# Patient Record
Sex: Female | Born: 1970 | Race: White | Hispanic: No | Marital: Single | Smoking: Never smoker
Health system: Southern US, Community
[De-identification: ages and names within clinical notes are randomized; demographics above are authoritative.]

## PROBLEM LIST (undated history)

## (undated) DIAGNOSIS — R569 Unspecified convulsions: Secondary | ICD-10-CM

## (undated) DIAGNOSIS — O009 Unspecified ectopic pregnancy without intrauterine pregnancy: Secondary | ICD-10-CM

## (undated) DIAGNOSIS — N200 Calculus of kidney: Secondary | ICD-10-CM

## (undated) DIAGNOSIS — F32A Depression, unspecified: Secondary | ICD-10-CM

## (undated) HISTORY — PX: TUBAL LIGATION: SHX77

## (undated) HISTORY — DX: Depression, unspecified: F32.A

---

## 2016-03-24 ENCOUNTER — Emergency Department
Admission: EM | Admit: 2016-03-24 | Discharge: 2016-03-24 | Disposition: A | Discharge status: Home / Self Care | Payer: Commercial Managed Care - PPO | Attending: Emergency Medicine | Admitting: Emergency Medicine

## 2016-03-24 ENCOUNTER — Emergency Department: Payer: Commercial Managed Care - PPO

## 2016-03-24 ENCOUNTER — Encounter: Payer: Self-pay | Admitting: Emergency Medicine

## 2016-03-24 DIAGNOSIS — N200 Calculus of kidney: Secondary | ICD-10-CM | POA: Diagnosis not present

## 2016-03-24 DIAGNOSIS — R109 Unspecified abdominal pain: Secondary | ICD-10-CM | POA: Diagnosis present

## 2016-03-24 DIAGNOSIS — Z79899 Other long term (current) drug therapy: Secondary | ICD-10-CM | POA: Diagnosis not present

## 2016-03-24 HISTORY — DX: Unspecified ectopic pregnancy without intrauterine pregnancy: O00.90

## 2016-03-24 HISTORY — DX: Calculus of kidney: N20.0

## 2016-03-24 HISTORY — DX: Unspecified convulsions: R56.9

## 2016-03-24 LAB — URINALYSIS, COMPLETE (UACMP) WITH MICROSCOPIC
Bilirubin Urine: NEGATIVE
Glucose, UA: NEGATIVE mg/dL
Ketones, ur: NEGATIVE mg/dL
Leukocytes, UA: NEGATIVE
Nitrite: NEGATIVE
Protein, ur: NEGATIVE mg/dL
SPECIFIC GRAVITY, URINE: 1.012 (ref 1.005–1.030)
pH: 6 (ref 5.0–8.0)

## 2016-03-24 LAB — BASIC METABOLIC PANEL
ANION GAP: 9 (ref 5–15)
BUN: 15 mg/dL (ref 6–20)
CHLORIDE: 103 mmol/L (ref 101–111)
CO2: 25 mmol/L (ref 22–32)
Calcium: 9.1 mg/dL (ref 8.9–10.3)
Creatinine, Ser: 1.06 mg/dL — ABNORMAL HIGH (ref 0.44–1.00)
GFR calc Af Amer: >60 mL/min (ref >60)
GLUCOSE: 139 mg/dL — AB (ref 65–99)
POTASSIUM: 3.9 mmol/L (ref 3.5–5.1)
Sodium: 137 mmol/L (ref 135–145)

## 2016-03-24 LAB — CBC
HEMATOCRIT: 39.2 % (ref 35.0–47.0)
HEMOGLOBIN: 13.4 g/dL (ref 12.0–16.0)
MCH: 28.6 pg (ref 26.0–34.0)
MCHC: 34.1 g/dL (ref 32.0–36.0)
MCV: 84 fL (ref 80.0–100.0)
Platelets: 246 10*3/uL (ref 150–440)
RBC: 4.68 MIL/uL (ref 3.80–5.20)
RDW: 13.6 % (ref 11.5–14.5)
WBC: 16 10*3/uL — AB (ref 3.6–11.0)

## 2016-03-24 MED ORDER — OXYCODONE-ACETAMINOPHEN 5-325 MG PO TABS: 1.0000 | ORAL_TABLET | Freq: Four times a day (QID) | ORAL | 0 refills | Status: DC | PRN

## 2016-03-24 MED ORDER — ONDANSETRON HCL 4 MG/2ML IJ SOLN
4.0000 mg | Freq: Once | INTRAMUSCULAR | Status: AC
  Administered 2016-03-24: 4 mg via INTRAVENOUS

## 2016-03-24 MED ORDER — TAMSULOSIN HCL 0.4 MG PO CAPS: 0.4000 mg | ORAL_CAPSULE | Freq: Every day | ORAL | 0 refills | Status: DC

## 2016-03-24 MED ORDER — ONDANSETRON HCL 4 MG/2ML IJ SOLN
INTRAMUSCULAR | Status: AC
  Filled 2016-03-24: qty 2

## 2016-03-24 MED ORDER — KETOROLAC TROMETHAMINE 30 MG/ML IJ SOLN
30.0000 mg | Freq: Once | INTRAMUSCULAR | Status: AC
  Administered 2016-03-24: 30 mg via INTRAVENOUS
  Filled 2016-03-24: qty 1

## 2016-03-24 MED ORDER — MORPHINE SULFATE (PF) 4 MG/ML IV SOLN
4.0000 mg | Freq: Once | INTRAVENOUS | Status: AC
  Administered 2016-03-24: 4 mg via INTRAVENOUS
  Filled 2016-03-24: qty 1

## 2016-03-24 MED ORDER — ONDANSETRON 4 MG PO TBDP: 4.0000 mg | ORAL_TABLET | Freq: Three times a day (TID) | ORAL | 0 refills | Status: DC | PRN

## 2016-03-24 NOTE — ED Triage Notes (Signed)
Patient reports left lower back/flank pain since Friday morning.  Reports increase in urinary output.

## 2016-03-24 NOTE — ED Provider Notes (Addendum)
[facility] Emergency Department Provider Note   ____________________________________________   First MD Initiated Contact with Patient 03/24/16 [id number]     (approximate)  I have reviewed the triage vital signs and the nursing notes.   HISTORY  Chief Complaint Back Pain and Flank Pain    HPI Margaret Cardenas is a 45 y.o. female who comes into the hospital today with some severe left flank pain. She reports that it was achy this morning but it has been progressively getting worse. She reports that it was at its worse this evening and has been persistent. She has had similar pain she reports in 2008 when she had a kidney stone. The patient denies any pain with urination and has not noticed any blood in her urine. The patient reports she also had some nausea and vomiting today which is what prompted her to come into the hospital. The patient had no fevers and no chills. She is here today for evaluation. The patient rates her pain a 10 out of 10 in intensity.   Past Medical History:  Diagnosis Date  . Ectopic pregnancy   . Kidney stones   . Seizures (HCC)     There are no active problems to display for this patient.   Past Surgical History:  Procedure Laterality Date  . TUBAL LIGATION      Prior to Admission medications   Medication Sig Start Date End Date Taking? Authorizing Provider  amLODipine (NORVASC) 10 MG tablet Take 10 mg by mouth daily.   Yes Historical Provider, MD  norgestimate-ethinyl estradiol (ORTHO-CYCLEN,SPRINTEC,PREVIFEM) 0.25-35 MG-MCG tablet Take 1 tablet by mouth daily.   Yes Historical Provider, MD  venlafaxine (EFFEXOR) 75 MG tablet Take 75 mg by mouth daily.   Yes Historical Provider, MD  ondansetron (ZOFRAN ODT) 4 MG disintegrating tablet Take 1 tablet (4 mg total) by mouth every 8 (eight) hours as needed for nausea or vomiting. 03/24/16   Rebecka Apley, MD  oxyCODONE-acetaminophen (ROXICET) 5-325 MG tablet Take 1 tablet by  mouth every 6 (six) hours as needed. 03/24/16   Rebecka Apley, MD  tamsulosin (FLOMAX) 0.4 MG CAPS capsule Take 1 capsule (0.4 mg total) by mouth daily. 03/24/16   Rebecka Apley, MD    Allergies Augmentin [amoxicillin-pot clavulanate]  No family history on file.  Social History Social History  Substance Use Topics  . Smoking status: Never Smoker  . Smokeless tobacco: Not on file  . Alcohol use Yes    Review of Systems Constitutional: No fever/chills Eyes: No visual changes. ENT: No sore throat. Cardiovascular: Denies chest pain. Respiratory: Denies shortness of breath. Gastrointestinal: Nausea and vomiting, No abdominal pain. No diarrhea.  No constipation. Genitourinary: Negative for dysuria. Musculoskeletal: back pain. Skin: Negative for rash. Neurological: Negative for headaches, focal weakness or numbness.  10-point ROS otherwise negative.  ____________________________________________   PHYSICAL EXAM:  VITAL SIGNS: ED Triage Vitals [03/24/16 0306]  Enc Vitals Group     BP 121/75     Pulse Rate (!) 104     Resp 18     Temp 98.7 F (37.1 C)     Temp Source Oral     SpO2 99 %     Weight 226 lb (102.5 kg)     Height 5' (1.524 m)     Head Circumference      Peak Flow      Pain Score 10     Pain Loc      Pain Edu?  Excl. in GC?     Constitutional: Alert and oriented. Well appearing and in Water distress. Eyes: Conjunctivae are normal. PERRL. EOMI. Head: Atraumatic. Nose: No congestion/rhinnorhea. Mouth/Throat: Mucous membranes are moist.  Oropharynx non-erythematous. Cardiovascular: Normal rate, regular rhythm. Grossly normal heart sounds.  Good peripheral circulation. Respiratory: Normal respiratory effort.  No retractions. Lungs CTAB. Gastrointestinal: Soft and nontender. No distention. Positive bowel sounds left CVA tenderness to palpation Musculoskeletal: No lower extremity tenderness nor edema.   Neurologic:  Normal speech and language.    Skin:  Skin is warm, dry and intact.  Psychiatric: Mood and affect are normal.   ____________________________________________   LABS (all labs ordered are listed, but only abnormal results are displayed)  Labs Reviewed  URINALYSIS, COMPLETE (UACMP) WITH MICROSCOPIC - Abnormal; Notable for the following:       Result Value   Color, Urine STRAW (*)    APPearance CLEAR (*)    Hgb urine dipstick SMALL (*)    Bacteria, UA RARE (*)    Squamous Epithelial / LPF 0-5 (*)    All other components within normal limits  CBC - Abnormal; Notable for the following:    WBC 16.0 (*)    All other components within normal limits  BASIC METABOLIC PANEL - Abnormal; Notable for the following:    Glucose, Bld 139 (*)    Creatinine, Ser 1.06 (*)    All other components within normal limits   ____________________________________________  EKG  none ____________________________________________  RADIOLOGY  CT renal stone study ____________________________________________   PROCEDURES  Procedure(s) performed: None  Procedures  Critical Care performed: No  ____________________________________________   INITIAL IMPRESSION / ASSESSMENT AND PLAN / ED COURSE  Pertinent labs & imaging results that were available during my care of the patient were reviewed by me and considered in my medical decision making (see chart for details).  This is a 45 year old female who comes into the hospital today with some left flank pain. The patient's urine does not show any blood but as 20% of stones do not show blood I will send the patient for a CT scan. I will give the patient a liter of normal saline, Zofran and morphine for her pain. She will be reassessed once I received those results. The patient's blood work shows a creatinine of 1.06 and a white blood cell count of 16.  Clinical Course as of Mar 24 636  Sat Mar 24, 2016  [id number] A 5 mm left UPJ stone with mild left hydronephrosis. Correlation with  urinalysis recommended to exclude superimposed UTI.   CT Renal Soundra PilonStone Study [AW]    Clinical Course User Index [AW] Rebecka ApleyAllison P Webster, MD   The patient does have a 5 mm left UPJ stone at this time. I will give the patient a dose of Toradol and then reassess the patient.   The patient's pain is improved. She'll be discharged home to follow-up with urology should the pain continue and she not pass her stone.  ____________________________________________   FINAL CLINICAL IMPRESSION(S) / ED DIAGNOSES  Final diagnoses:  Kidney stone      NEW MEDICATIONS STARTED DURING THIS VISIT:  New Prescriptions   ONDANSETRON (ZOFRAN ODT) 4 MG DISINTEGRATING TABLET    Take 1 tablet (4 mg total) by mouth every 8 (eight) hours as needed for nausea or vomiting.   OXYCODONE-ACETAMINOPHEN (ROXICET) 5-325 MG TABLET    Take 1 tablet by mouth every 6 (six) hours as needed.   TAMSULOSIN (FLOMAX) 0.4 MG CAPS CAPSULE  Take 1 capsule (0.4 mg total) by mouth daily.     Note:  This document was prepared using Dragon voice recognition software and may include unintentional dictation errors.    Rebecka ApleyAllison P Webster, MD 03/24/16 [id number]    Rebecka ApleyAllison P Webster, MD 03/24/16 [phone]

## 2018-11-13 IMAGING — CT CT RENAL STONE PROTOCOL
2 of 4 series · 16 of 46 positions shown, 18 images · non-contrast
Comparison: None.

CLINICAL DATA: 45-year-old female with left flank pain.

EXAM:
CT ABDOMEN AND PELVIS WITHOUT CONTRAST
TECHNIQUE: Multidetector CT imaging of the abdomen and pelvis was performed
following the standard protocol without IV contrast.

[Series 2: stone full standard · axial · 0.75mm/px · z∈[-1026,-616]mm · 13 of 90 slices shown, 15 images]
[im 4/90  soft-tissue]
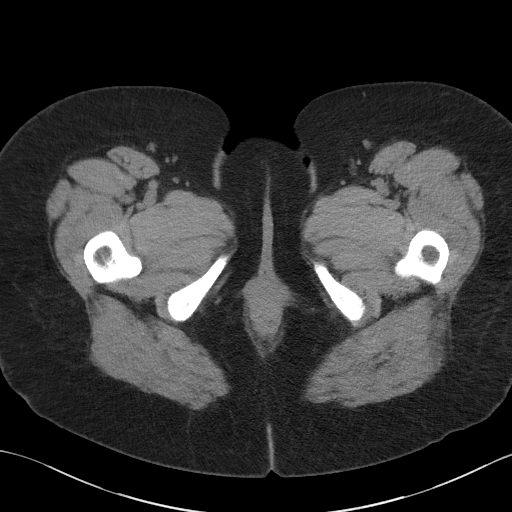
[im 4/90  bone]
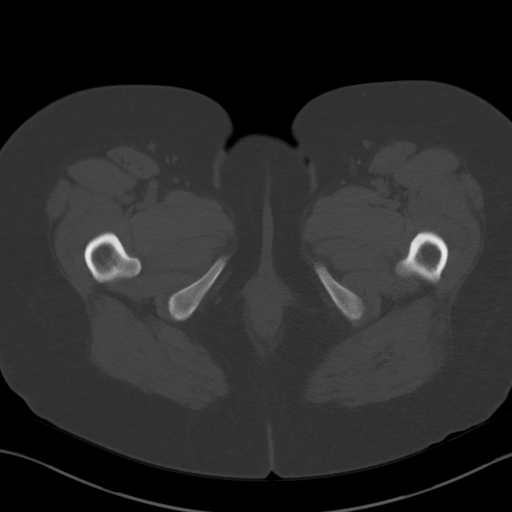
[im 12/90  soft-tissue]
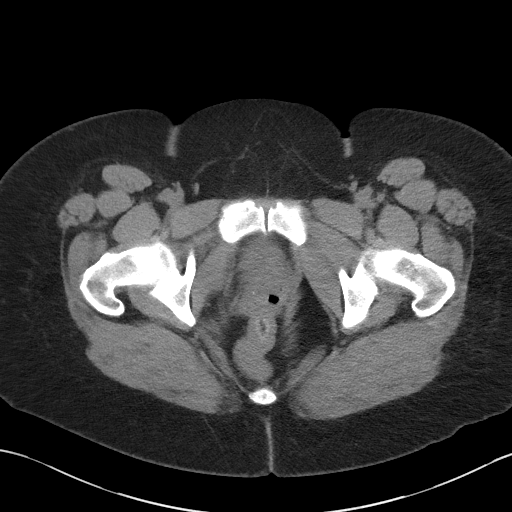
[im 20/90  soft-tissue]
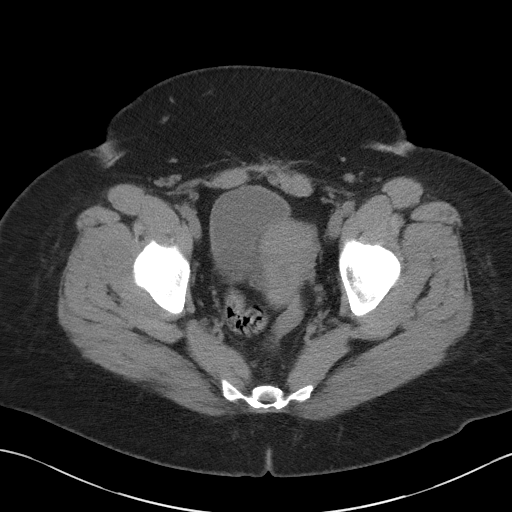
[im 24/90  soft-tissue]
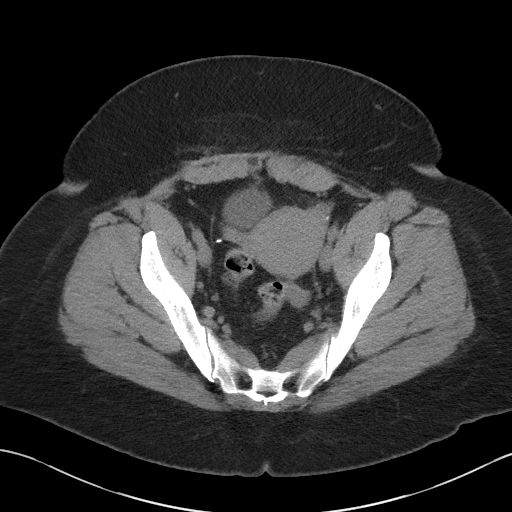
[im 31/90  soft-tissue]
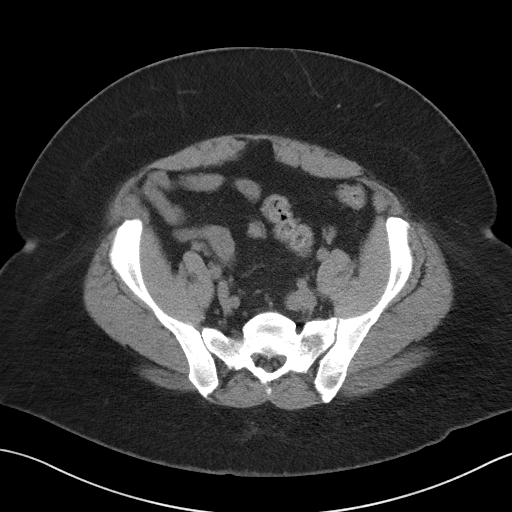
[im 39/90  soft-tissue]
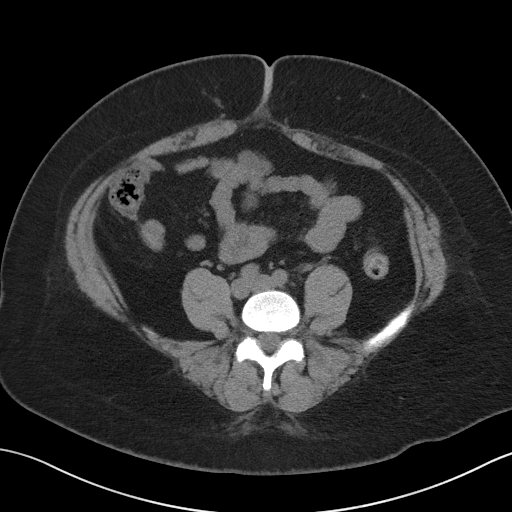
[im 47/90  soft-tissue]
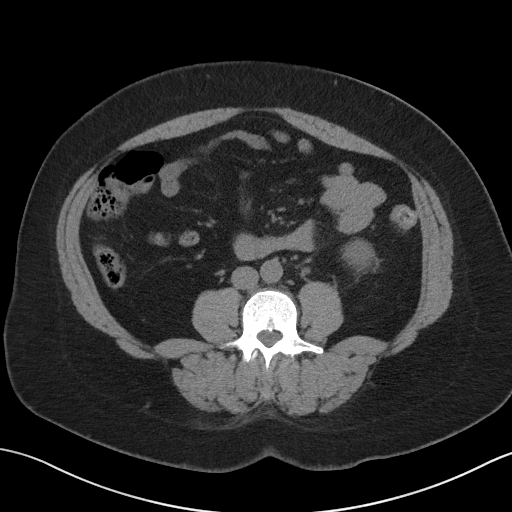
[im 51/90  soft-tissue]
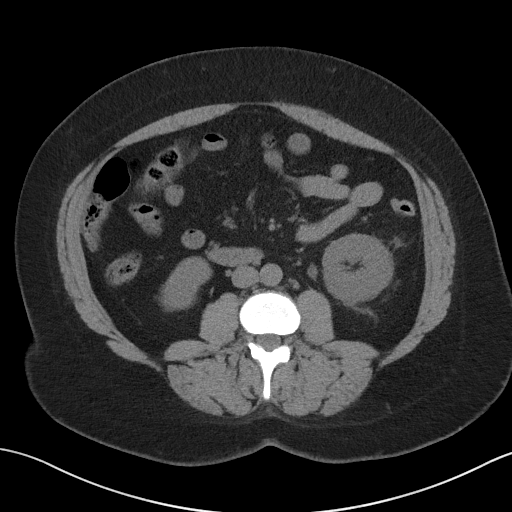
[im 59/90  soft-tissue]
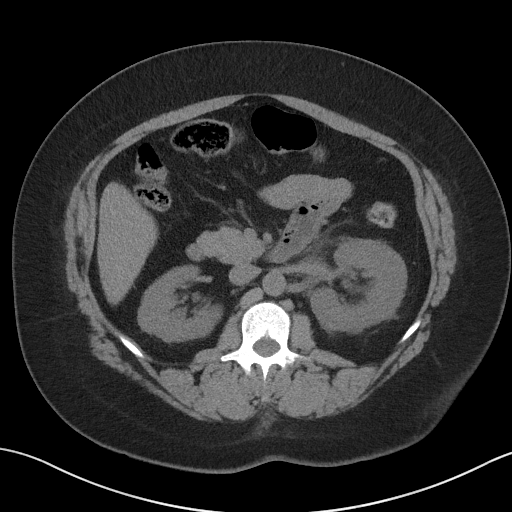
[im 59/90  bone]
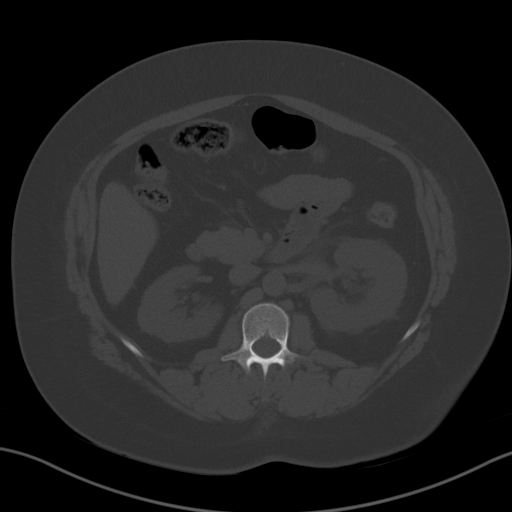
[im 66/90  soft-tissue]
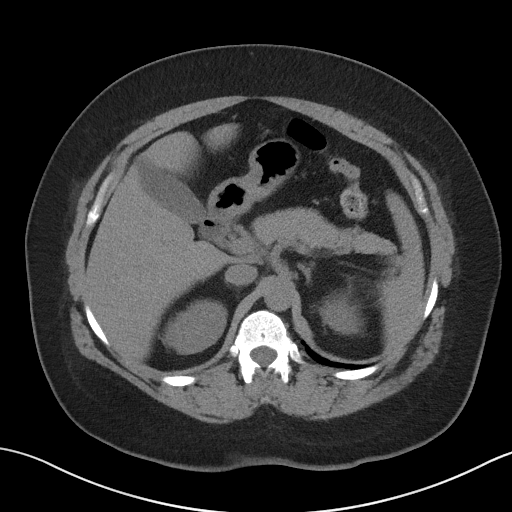
[im 70/90  soft-tissue]
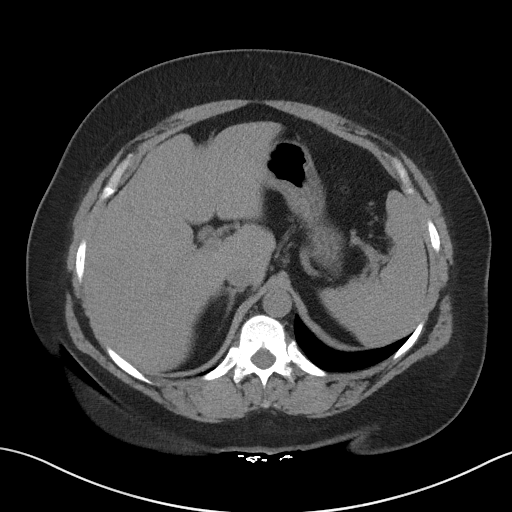
[im 78/90  soft-tissue]
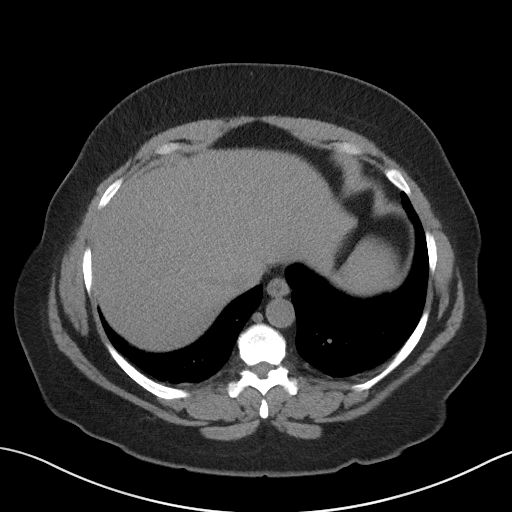
[im 86/90  soft-tissue]
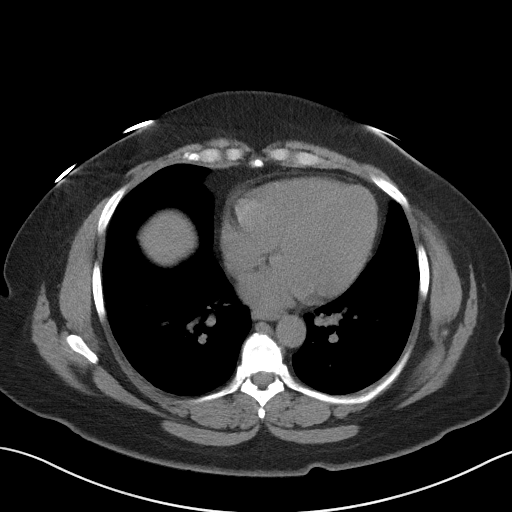

[Series 5: coronal · coronal · 0.75mm/px · 3 of 151 slices shown]
[im 51/151  soft-tissue]
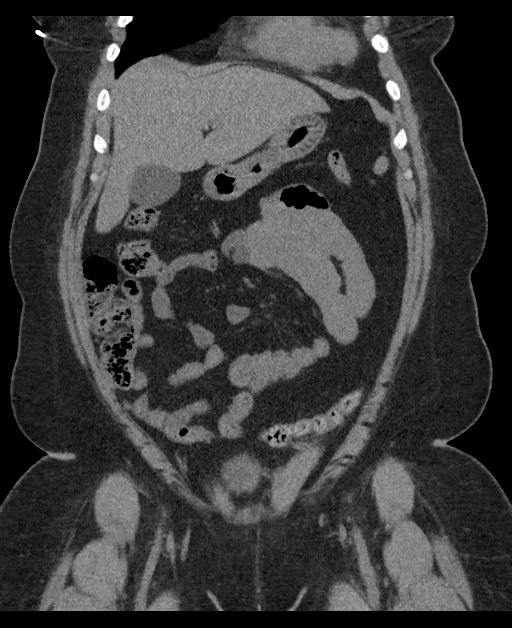
[im 67/151  soft-tissue]
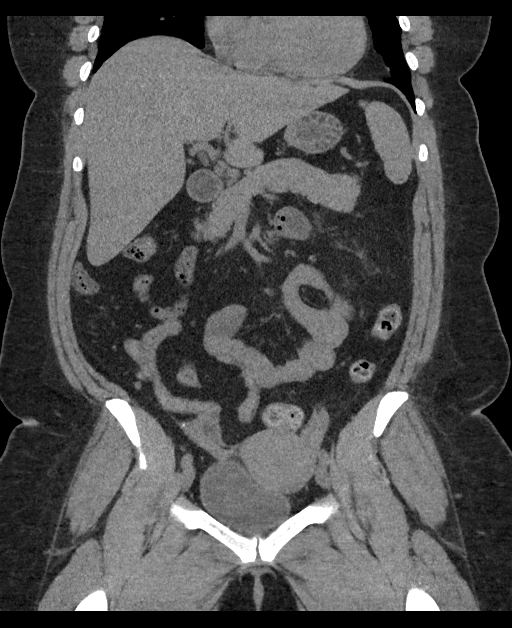
[im 84/151  soft-tissue]
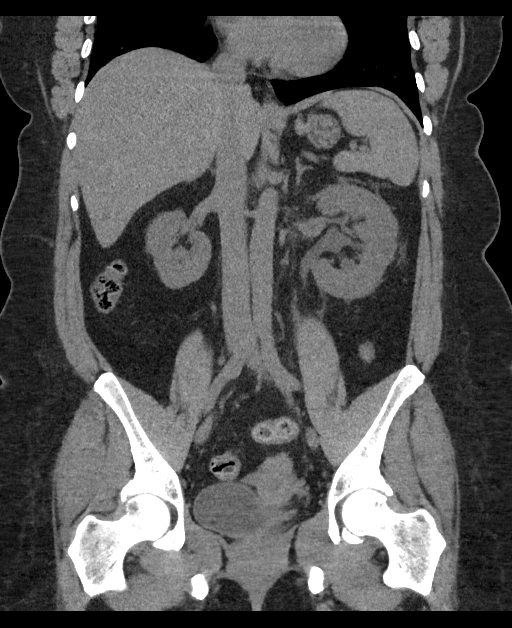

[16 of 46 positions shown; findings below may reference images not displayed]

FINDINGS: Evaluation of this exam is limited in the absence of intravenous
contrast.

Lower chest: The visualized lung bases are clear. There is
hypoattenuation of the cardiac blood pool suggestive of a degree of
anemia. Clinical correlation is recommended.

No intra-abdominal free air or free fluid.

Hepatobiliary: Diffuse fatty infiltration of the liver. No
intrahepatic biliary ductal dilatation. The gallbladder is
unremarkable.

Pancreas: Unremarkable. No pancreatic ductal dilatation or
surrounding inflammatory changes.

Spleen: Normal in size without focal abnormality.

Adrenals/Urinary Tract: The adrenal glands appear unremarkable.
There is a 5 mm left ureteropelvic junction stone with mild left
hydronephrosis. There is mild left perinephric stranding. The right
kidney, right ureter, and urinary bladder appear unremarkable.

Stomach/Bowel: There is moderate stool within the colon. No evidence
of bowel obstruction or active inflammation. Normal appendix.

Vascular/Lymphatic: The abdominal aorta and IVC are grossly
unremarkable on this noncontrast study. No portal venous gas
identified. There is no adenopathy.

Reproductive: The uterus is anteverted and grossly unremarkable.
Bilateral tubal ligation clips noted. The ovaries appear
unremarkable as well.

Other: None

Musculoskeletal: No acute or significant osseous findings.
IMPRESSION: A 5 mm left UPJ stone with mild left hydronephrosis. Correlation
with urinalysis recommended to exclude superimposed UTI.

## 2020-03-17 ENCOUNTER — Encounter: Payer: Self-pay | Admitting: Obstetrics and Gynecology

## 2020-03-17 ENCOUNTER — Ambulatory Visit: Payer: Commercial Managed Care - PPO | Admitting: Obstetrics and Gynecology

## 2020-03-17 ENCOUNTER — Other Ambulatory Visit (HOSPITAL_COMMUNITY)
Admission: RE | Admit: 2020-03-17 | Discharge: 2020-03-17 | Disposition: A | Discharge status: Home / Self Care | Payer: Commercial Managed Care - PPO | Source: Ambulatory Visit | Attending: Obstetrics and Gynecology | Admitting: Obstetrics and Gynecology

## 2020-03-17 ENCOUNTER — Other Ambulatory Visit: Payer: Self-pay

## 2020-03-17 VITALS — BP 115/86 | HR 81 | Ht 60.0 in | Wt 233.9 lb

## 2020-03-17 DIAGNOSIS — N921 Excessive and frequent menstruation with irregular cycle: Secondary | ICD-10-CM | POA: Insufficient documentation

## 2020-03-17 NOTE — Progress Notes (Signed)
HPI:      [name] is a 49 y.o. No obstetric history on file. who LMP was No LMP recorded. (Menstrual status: Other).  Subjective:   She presents today stating that over the last year her menstrual periods have become very heavy and long.  She sometimes has 2 periods per month or bleeds for 20 days/month.  She is understandably frustrated by this.  She does report that she occasionally has night sweats but denies hot flashes.  She is not currently sexually active and not using anything for birth control. She would like some cycle control so that she does not bleed to this extent.    Hx: The following portions of the patient's history were reviewed and updated as appropriate:             She  has a past medical history of Depression, Ectopic pregnancy, Kidney stones, and Seizures (HCC). She does not have a problem list on file. She  has a past surgical history that includes Tubal ligation. Her family history is not on file. She  reports that she has never smoked. She has never used smokeless tobacco. She reports current alcohol use. She reports that she does not use drugs. She has a current medication list which includes the following prescription(s): brexpiprazole and venlafaxine hcl. She is allergic to augmentin [amoxicillin-pot clavulanate].       Review of Systems:  Review of Systems  Constitutional: Denied constitutional symptoms, night sweats, recent illness, fatigue, fever, insomnia and weight loss.  Eyes: Denied eye symptoms, eye pain, photophobia, vision change and visual disturbance.  Ears/Nose/Throat/Neck: Denied ear, nose, throat or neck symptoms, hearing loss, nasal discharge, sinus congestion and sore throat.  Cardiovascular: Denied cardiovascular symptoms, arrhythmia, chest pain/pressure, edema, exercise intolerance, orthopnea and palpitations.  Respiratory: Denied pulmonary symptoms, asthma, pleuritic pain, productive sputum, cough, dyspnea and wheezing.   Gastrointestinal: Denied, gastro-esophageal reflux, melena, nausea and vomiting.  Genitourinary: See HPI for additional information.  Musculoskeletal: Denied musculoskeletal symptoms, stiffness, swelling, muscle weakness and myalgia.  Dermatologic: Denied dermatology symptoms, rash and scar.  Neurologic: Denied neurology symptoms, dizziness, headache, neck pain and syncope.  Psychiatric: Denied psychiatric symptoms, anxiety and depression.  Endocrine: Denied endocrine symptoms including hot flashes and night sweats.   Meds:   Current Outpatient Medications on File Prior to Visit  Medication Sig Dispense Refill  . brexpiprazole (REXULTI) 1 MG TABS tablet Take by mouth.    . Venlafaxine HCl 225 MG TB24      No current facility-administered medications on file prior to visit.          Objective:     Vitals:   03/17/20 1435  BP: 115/86  Pulse: 81   Filed Weights   03/17/20 1435  Weight: 233 lb 14.4 oz (106.1 kg)              Physical examination   Pelvic:   Vulva: Normal appearance.  No lesions.  Vagina: No lesions or abnormalities noted.  Support: Normal pelvic support.  Urethra No masses tenderness or scarring.  Meatus Normal size without lesions or prolapse.  Cervix: Normal appearance.  No lesions.  Anus: Normal exam.  No lesions.  Perineum: Normal exam.  No lesions.        Bimanual   Uterus: Normal size.  Non-tender.  Mobile.  AV.  Adnexae: No masses.  Non-tender to palpation.  Cul-de-sac: Negative for abnormality.   Endometrial Biopsy After discussion with the patient regarding her abnormal uterine bleeding I  recommended that she proceed with an endometrial biopsy for further diagnosis. The risks, benefits, alternatives, and indications for an endometrial biopsy were discussed with the patient in detail. She understood the risks including infection, bleeding, cervical laceration and uterine perforation.  Verbal consent was obtained.   PROCEDURE NOTE:  Vacurette  endometrial biopsy was performed using aseptic technique with iodine preparation.  The uterus was sounded to a length of 8cm.  Adequate sampling was obtained with minimal blood loss.  The patient tolerated the procedure well.  Disposition will be pending pathology   Assessment:    No obstetric history on file. There are no problems to display for this patient.    1. Menometrorrhagia        Plan:            1. Rule out uterine fibroids with ultrasound  2. Await endometrial biopsy results  3. Plan IUD for cycle control Orders Orders Placed This Encounter  Procedures  . US PELVIS (TRANSABDOMINAL ONLY)  . US PELVIS TRANSVAGINAL NON-OB (TV ONLY)    No orders of the defined types were placed in this encounter.     F/U  Return in about 3 weeks (around 04/07/2020). I spent 32 minutes involved in the care of this patient preparing to see the patient by obtaining and reviewing her medical history (including labs, imaging tests and prior procedures), documenting clinical information in the electronic health record (EHR), counseling and coordinating care plans, writing and sending prescriptions, ordering tests or procedures and directly communicating with the patient by discussing pertinent items from her history and physical exam as well as detailing my assessment and plan as noted above so that she has an informed understanding.  All of her questions were answered.  Elonda Husky, M.D. 03/17/2020 3:10 PM

## 2020-03-17 NOTE — Addendum Note (Signed)
Addended by: Dorian Pod on: 03/17/2020 03:44 PM   Modules accepted: Orders

## 2020-03-21 LAB — SURGICAL PATHOLOGY

## 2020-03-22 ENCOUNTER — Telehealth: Payer: Self-pay

## 2020-03-22 NOTE — Telephone Encounter (Signed)
Informed patient of negative biopsy results per Dr Logan Bores.

## 2020-03-24 ENCOUNTER — Other Ambulatory Visit: Payer: Commercial Managed Care - PPO

## 2020-03-31 ENCOUNTER — Ambulatory Visit (INDEPENDENT_AMBULATORY_CARE_PROVIDER_SITE_OTHER): Payer: Commercial Managed Care - PPO

## 2020-03-31 ENCOUNTER — Other Ambulatory Visit: Payer: Self-pay

## 2020-03-31 DIAGNOSIS — N921 Excessive and frequent menstruation with irregular cycle: Secondary | ICD-10-CM | POA: Diagnosis not present

## 2020-04-07 ENCOUNTER — Encounter: Payer: Self-pay | Admitting: Obstetrics and Gynecology

## 2020-04-07 ENCOUNTER — Other Ambulatory Visit: Payer: Self-pay

## 2020-04-07 ENCOUNTER — Ambulatory Visit: Payer: Commercial Managed Care - PPO | Admitting: Obstetrics and Gynecology

## 2020-04-07 VITALS — BP 134/81 | HR 86 | Ht 60.0 in | Wt 232.5 lb

## 2020-04-07 DIAGNOSIS — Z30430 Encounter for insertion of intrauterine contraceptive device: Secondary | ICD-10-CM | POA: Diagnosis not present

## 2020-04-07 DIAGNOSIS — N921 Excessive and frequent menstruation with irregular cycle: Secondary | ICD-10-CM

## 2020-04-07 NOTE — Progress Notes (Signed)
HPI:      [name] is a 50 y.o. No obstetric history on file. who LMP was Patient's last menstrual period was 03/08/2020.  Subjective:   She presents today for IUD insertion.  She has had irregular bleeding and wants the IUD for both cycle and birth control.  A recent endometrial biopsy showed no evidence of hyperplasia or malignancy.  An ultrasound showed no contraindication to IUD placement.  Patient states that she has not recently been sexually active and thus could not be pregnant.    Hx: The following portions of the patient's history were reviewed and updated as appropriate:             She  has a past medical history of Depression, Ectopic pregnancy, Kidney stones, and Seizures (HCC). She does not have a problem list on file. She  has a past surgical history that includes Tubal ligation. Her family history is not on file. She  reports that she has never smoked. She has never used smokeless tobacco. She reports current alcohol use. She reports that she does not use drugs. She has a current medication list which includes the following prescription(s): brexpiprazole and venlafaxine hcl. She is allergic to augmentin [amoxicillin-pot clavulanate].       Review of Systems:  Review of Systems  Constitutional: Denied constitutional symptoms, night sweats, recent illness, fatigue, fever, insomnia and weight loss.  Eyes: Denied eye symptoms, eye pain, photophobia, vision change and visual disturbance.  Ears/Nose/Throat/Neck: Denied ear, nose, throat or neck symptoms, hearing loss, nasal discharge, sinus congestion and sore throat.  Cardiovascular: Denied cardiovascular symptoms, arrhythmia, chest pain/pressure, edema, exercise intolerance, orthopnea and palpitations.  Respiratory: Denied pulmonary symptoms, asthma, pleuritic pain, productive sputum, cough, dyspnea and wheezing.  Gastrointestinal: Denied, gastro-esophageal reflux, melena, nausea and vomiting.  Genitourinary: Denied  genitourinary symptoms including symptomatic vaginal discharge, pelvic relaxation issues, and urinary complaints.  Musculoskeletal: Denied musculoskeletal symptoms, stiffness, swelling, muscle weakness and myalgia.  Dermatologic: Denied dermatology symptoms, rash and scar.  Neurologic: Denied neurology symptoms, dizziness, headache, neck pain and syncope.  Psychiatric: Denied psychiatric symptoms, anxiety and depression.  Endocrine: Denied endocrine symptoms including hot flashes and night sweats.   Meds:   Current Outpatient Medications on File Prior to Visit  Medication Sig Dispense Refill  . brexpiprazole (REXULTI) 1 MG TABS tablet Take by mouth.    . Venlafaxine HCl 225 MG TB24      No current facility-administered medications on file prior to visit.    Objective:     Vitals:   04/07/20 0846  BP: 134/81  Pulse: 86    Physical examination   Pelvic:   Vulva: Normal appearance.  No lesions.  Vagina: No lesions or abnormalities noted.  Support: Normal pelvic support.  Urethra No masses tenderness or scarring.  Meatus Normal size without lesions or prolapse.  Cervix: Normal appearance.  No lesions.  Anus: Normal exam.  No lesions.  Perineum: Normal exam.  No lesions.        Bimanual   Uterus: Normal size.  Non-tender.  Mobile.  AV.  Adnexae: No masses.  Non-tender to palpation.  Cul-de-sac: Negative for abnormality.   IUD Procedure Pt has read the booklet and signed the appropriate forms regarding the Mirena IUD.  All of her questions have been answered.   The cervix was cleansed with betadine solution.  After sounding the uterus and noting the position, the IUD was placed in the usual manner without problem.  The string was cut to  the appropriate length.  The patient tolerated the procedure well.       NDC # = [id number]        Assessment:    No obstetric history on file. There are no problems to display for this patient.    1. Encounter for insertion of mirena  IUD   2. Menometrorrhagia       Plan:             F/U  Return in about 4 weeks (around 05/05/2020) for For IUD f/u.  Elonda Husky, M.D. 04/07/2020 9:30 AM

## 2020-05-18 ENCOUNTER — Encounter: Payer: Commercial Managed Care - PPO | Admitting: Obstetrics and Gynecology

## 2020-05-24 ENCOUNTER — Ambulatory Visit: Payer: Commercial Managed Care - PPO | Admitting: Obstetrics and Gynecology

## 2020-05-24 ENCOUNTER — Encounter: Payer: Self-pay | Admitting: Obstetrics and Gynecology

## 2020-05-24 ENCOUNTER — Other Ambulatory Visit: Payer: Self-pay

## 2020-05-24 VITALS — BP 134/78 | HR 101 | Ht 60.0 in | Wt 231.7 lb

## 2020-05-24 DIAGNOSIS — N921 Excessive and frequent menstruation with irregular cycle: Secondary | ICD-10-CM | POA: Diagnosis not present

## 2020-05-24 DIAGNOSIS — Z30431 Encounter for routine checking of intrauterine contraceptive device: Secondary | ICD-10-CM

## 2020-05-24 NOTE — Progress Notes (Signed)
HPI:      [name] is a 50 y.o. No obstetric history on file. who LMP was No LMP recorded. (Menstrual status: IUD).  Subjective:   She presents today for follow-up of IUD.  She got an IUD for menometrorrhagia.  She is not currently using it for birth control.  She is very excited that she has had 15 bleeding free days since the placement of the IUD.  This is the most that she has had in a long time.    Hx: The following portions of the patient's history were reviewed and updated as appropriate:             She  has a past medical history of Depression, Ectopic pregnancy, Kidney stones, and Seizures (HCC). She does not have a problem list on file. She  has a past surgical history that includes Tubal ligation. Her family history is not on file. She  reports that she has never smoked. She has never used smokeless tobacco. She reports current alcohol use. She reports that she does not use drugs. She has a current medication list which includes the following prescription(s): brexpiprazole, levonorgestrel, and venlafaxine hcl. She is allergic to augmentin [amoxicillin-pot clavulanate].       Review of Systems:  Review of Systems  Constitutional: Denied constitutional symptoms, night sweats, recent illness, fatigue, fever, insomnia and weight loss.  Eyes: Denied eye symptoms, eye pain, photophobia, vision change and visual disturbance.  Ears/Nose/Throat/Neck: Denied ear, nose, throat or neck symptoms, hearing loss, nasal discharge, sinus congestion and sore throat.  Cardiovascular: Denied cardiovascular symptoms, arrhythmia, chest pain/pressure, edema, exercise intolerance, orthopnea and palpitations.  Respiratory: Denied pulmonary symptoms, asthma, pleuritic pain, productive sputum, cough, dyspnea and wheezing.  Gastrointestinal: Denied, gastro-esophageal reflux, melena, nausea and vomiting.  Genitourinary: Denied genitourinary symptoms including symptomatic vaginal discharge, pelvic  relaxation issues, and urinary complaints.  Musculoskeletal: Denied musculoskeletal symptoms, stiffness, swelling, muscle weakness and myalgia.  Dermatologic: Denied dermatology symptoms, rash and scar.  Neurologic: Denied neurology symptoms, dizziness, headache, neck pain and syncope.  Psychiatric: Denied psychiatric symptoms, anxiety and depression.  Endocrine: Denied endocrine symptoms including hot flashes and night sweats.   Meds:   Current Outpatient Medications on File Prior to Visit  Medication Sig Dispense Refill  . brexpiprazole (REXULTI) 1 MG TABS tablet Take by mouth.    . levonorgestrel (MIRENA) 20 MCG/24HR IUD 1 each by Intrauterine route once.    . Venlafaxine HCl 225 MG TB24      No current facility-administered medications on file prior to visit.       The pregnancy intention screening data noted above was reviewed. Potential methods of contraception were discussed. The patient elected to proceed with IUD or IUS.     Objective:     Vitals:   05/24/20 0846  BP: 134/78  Pulse: (!) 101   Filed Weights   05/24/20 0846  Weight: 231 lb 11.2 oz (105.1 kg)              Physical examination   Pelvic:   Vulva: Normal appearance.  No lesions.  Vagina: No lesions or abnormalities noted.  Support: Normal pelvic support.  Urethra No masses tenderness or scarring.  Meatus Normal size without lesions or prolapse.  Cervix: Normal appearance.  No lesions. IUD strings noted at cervical os.  Anus: Normal exam.  No lesions.  Perineum: Normal exam.  No lesions.        Bimanual   Uterus: Normal size.  Non-tender.  Mobile.  AV.  Adnexae: No masses.  Non-tender to palpation.  Cul-de-sac: Negative for abnormality.     Assessment:    No obstetric history on file. There are no problems to display for this patient.    1. Surveillance of previously prescribed intrauterine contraceptive device   2. Menometrorrhagia     Menometrorrhagia seems to be resolved.  IUD  correctly positioned.  Patient having success with a significant decrease in bleeding.   Plan:            1.  Follow-up for annual examination. Orders No orders of the defined types were placed in this encounter.   No orders of the defined types were placed in this encounter.     F/U  Return in about 4 months (around 09/21/2020) for Annual Physical. I spent 21 minutes involved in the care of this patient preparing to see the patient by obtaining and reviewing her medical history (including labs, imaging tests and prior procedures), documenting clinical information in the electronic health record (EHR), counseling and coordinating care plans, writing and sending prescriptions, ordering tests or procedures and directly communicating with the patient by discussing pertinent items from her history and physical exam as well as detailing my assessment and plan as noted above so that she has an informed understanding.  All of her questions were answered.  Elonda Husky, M.D. 05/24/2020 8:59 AM

## 2020-09-27 ENCOUNTER — Other Ambulatory Visit (HOSPITAL_COMMUNITY)
Admission: RE | Admit: 2020-09-27 | Discharge: 2020-09-27 | Disposition: A | Discharge status: Home / Self Care | Payer: Commercial Managed Care - PPO | Source: Ambulatory Visit | Attending: Obstetrics and Gynecology | Admitting: Obstetrics and Gynecology

## 2020-09-27 ENCOUNTER — Encounter: Payer: Self-pay | Admitting: Obstetrics and Gynecology

## 2020-09-27 ENCOUNTER — Ambulatory Visit (INDEPENDENT_AMBULATORY_CARE_PROVIDER_SITE_OTHER): Payer: Commercial Managed Care - PPO | Admitting: Obstetrics and Gynecology

## 2020-09-27 ENCOUNTER — Other Ambulatory Visit: Payer: Self-pay

## 2020-09-27 VITALS — BP 127/79 | HR 103 | Ht 60.0 in | Wt 235.5 lb

## 2020-09-27 DIAGNOSIS — Z1231 Encounter for screening mammogram for malignant neoplasm of breast: Secondary | ICD-10-CM | POA: Diagnosis not present

## 2020-09-27 DIAGNOSIS — Z01419 Encounter for gynecological examination (general) (routine) without abnormal findings: Secondary | ICD-10-CM | POA: Diagnosis not present

## 2020-09-27 DIAGNOSIS — Z124 Encounter for screening for malignant neoplasm of cervix: Secondary | ICD-10-CM

## 2020-09-27 DIAGNOSIS — Z6841 Body Mass Index (BMI) 40.0 and over, adult: Secondary | ICD-10-CM

## 2020-09-27 NOTE — Addendum Note (Signed)
Addended by: Dorian Pod on: 09/27/2020 10:17 AM   Modules accepted: Orders

## 2020-09-27 NOTE — Progress Notes (Signed)
HPI:      [name] is a 50 y.o. No obstetric history on file. who LMP was No LMP recorded (lmp unknown). (Menstrual status: IUD).  Subjective:   She presents today for her annual examination.  She has no complaints.  She has an IUD for cycle control.  She reports that this is "the best decision of her life".  She is very happy with her lack of bleeding with IUD. She is due for mammography and Pap smear today.  She does report a remote history of positive HPV but this resolved with her following Pap.    Hx: The following portions of the patient's history were reviewed and updated as appropriate:             She  has a past medical history of Depression, Ectopic pregnancy, Kidney stones, and Seizures (HCC). She does not have a problem list on file. She  has a past surgical history that includes Tubal ligation. Her family history is not on file. She  reports that she has never smoked. She has never used smokeless tobacco. She reports current alcohol use. She reports that she does not use drugs. She has a current medication list which includes the following prescription(s): brexpiprazole, levonorgestrel, and venlafaxine hcl. She is allergic to augmentin [amoxicillin-pot clavulanate].       Review of Systems:  Review of Systems  Constitutional: Denied constitutional symptoms, night sweats, recent illness, fatigue, fever, insomnia and weight loss.  Eyes: Denied eye symptoms, eye pain, photophobia, vision change and visual disturbance.  Ears/Nose/Throat/Neck: Denied ear, nose, throat or neck symptoms, hearing loss, nasal discharge, sinus congestion and sore throat.  Cardiovascular: Denied cardiovascular symptoms, arrhythmia, chest pain/pressure, edema, exercise intolerance, orthopnea and palpitations.  Respiratory: Denied pulmonary symptoms, asthma, pleuritic pain, productive sputum, cough, dyspnea and wheezing.  Gastrointestinal: Denied, gastro-esophageal reflux, melena, nausea and  vomiting.  Genitourinary: Denied genitourinary symptoms including symptomatic vaginal discharge, pelvic relaxation issues, and urinary complaints.  Musculoskeletal: Denied musculoskeletal symptoms, stiffness, swelling, muscle weakness and myalgia.  Dermatologic: Denied dermatology symptoms, rash and scar.  Neurologic: Denied neurology symptoms, dizziness, headache, neck pain and syncope.  Psychiatric: Denied psychiatric symptoms, anxiety and depression.  Endocrine: Denied endocrine symptoms including hot flashes and night sweats.   Meds:   Current Outpatient Medications on File Prior to Visit  Medication Sig Dispense Refill   brexpiprazole (REXULTI) 1 MG TABS tablet Take by mouth.     levonorgestrel (MIRENA) 20 MCG/24HR IUD 1 each by Intrauterine route once.     Venlafaxine HCl 225 MG TB24      No current facility-administered medications on file prior to visit.        The pregnancy intention screening data noted above was reviewed. Potential methods of contraception were discussed. The patient elected to proceed with IUD or IUS.     Objective:     Vitals:   09/27/20 0858  BP: 127/79  Pulse: (!) 103    Filed Weights   09/27/20 0858  Weight: 235 lb 8 oz (106.8 kg)              Physical examination General NAD, Conversant  HEENT Atraumatic; Op clear with mmm.  Normo-cephalic. Pupils reactive. Anicteric sclerae  Thyroid/Neck Smooth without nodularity or enlargement. Normal ROM.  Neck Supple.  Skin No rashes, lesions or ulceration. Normal palpated skin turgor. No nodularity.  Breasts: No masses or discharge.  Symmetric.  No axillary adenopathy.  Lungs: Clear to auscultation.No rales or wheezes. Normal Respiratory  effort, no retractions.  Heart: NSR.  No murmurs or rubs appreciated. No periferal edema  Abdomen: Soft.  Non-tender.  No masses.  No HSM. No hernia  Extremities: Moves all appropriately.  Normal ROM for age. No lymphadenopathy.  Neuro: Oriented to PPT.  Normal  mood. Normal affect.     Pelvic:   Vulva: Normal appearance.  No lesions.  Vagina: No lesions or abnormalities noted.  Support: Normal pelvic support.  Urethra No masses tenderness or scarring.  Meatus Normal size without lesions or prolapse.  Cervix: Normal appearance.  No lesions.  IUD strings noted at cervix  Anus: Normal exam.  No lesions.  Perineum: Normal exam.  No lesions.        Bimanual   Uterus: Normal size.  Non-tender.  Mobile.  AV.  Adnexae: No masses.  Non-tender to palpation.  Cul-de-sac: Negative for abnormality.   Exam limited by patient body habitus  Assessment:    No obstetric history on file. There are no problems to display for this patient.    1. Well woman exam with routine gynecological exam   2. Encounter for screening mammogram for malignant neoplasm of breast   3. Morbid obesity with BMI of 45.0-49.9, adult [facility])     Patient doing well with IUD   Plan:            1.  Basic Screening Recommendations The basic screening recommendations for asymptomatic women were discussed with the patient during her visit.  The age-appropriate recommendations were discussed with her and the rational for the tests reviewed.  When I am informed by the patient that another primary care physician has previously obtained the age-appropriate tests and they are up-to-date, only outstanding tests are ordered and referrals given as necessary.  Abnormal results of tests will be discussed with her when all of her results are completed.  Routine preventative health maintenance measures emphasized: Exercise/Diet/Weight control, Tobacco Warnings, Alcohol/Substance use risks and Stress Management Pap Co-test performed-mammogram ordered-lab work ordered. Orders Orders Placed This Encounter  Procedures   MM 3D SCREEN BREAST BILATERAL   Hemoglobin A1c   Lipid panel   TSH    No orders of the defined types were placed in this encounter.         F/U  Return in about 1 year  (around 09/27/2021) for Annual Physical.  Elonda Husky, M.D. 09/27/2020 9:25 AM

## 2020-09-28 ENCOUNTER — Other Ambulatory Visit: Payer: Commercial Managed Care - PPO

## 2020-09-29 LAB — LIPID PANEL
Chol/HDL Ratio: 2 ratio (ref 0.0–4.4)
Cholesterol, Total: 149 mg/dL (ref 100–199)
HDL: 74 mg/dL (ref >39)
LDL Chol Calc (NIH): 61 mg/dL (ref 0–99)
Triglycerides: 69 mg/dL (ref 0–149)
VLDL Cholesterol Cal: 14 mg/dL (ref 5–40)

## 2020-09-29 LAB — TSH: TSH: 2.05 u[IU]/mL (ref 0.450–4.500)

## 2020-09-29 LAB — HEMOGLOBIN A1C
Est. average glucose Bld gHb Est-mCnc: 117 mg/dL
Hgb A1c MFr Bld: 5.7 % — ABNORMAL HIGH (ref 4.8–5.6)

## 2020-09-30 LAB — CYTOLOGY - PAP
Comment: NEGATIVE
Diagnosis: NEGATIVE
High risk HPV: NEGATIVE

## 2021-09-20 HISTORY — PX: THYROIDECTOMY: SHX17

## 2021-09-28 ENCOUNTER — Ambulatory Visit (INDEPENDENT_AMBULATORY_CARE_PROVIDER_SITE_OTHER): Payer: Commercial Managed Care - PPO | Admitting: Obstetrics and Gynecology

## 2021-09-28 ENCOUNTER — Encounter: Payer: Self-pay | Admitting: Obstetrics and Gynecology

## 2021-09-28 VITALS — BP 141/89 | HR 93 | Ht 60.0 in | Wt 239.8 lb

## 2021-09-28 DIAGNOSIS — B3731 Acute candidiasis of vulva and vagina: Secondary | ICD-10-CM | POA: Diagnosis not present

## 2021-09-28 DIAGNOSIS — Z1231 Encounter for screening mammogram for malignant neoplasm of breast: Secondary | ICD-10-CM

## 2021-09-28 DIAGNOSIS — Z01419 Encounter for gynecological examination (general) (routine) without abnormal findings: Secondary | ICD-10-CM | POA: Diagnosis not present

## 2021-09-28 MED ORDER — FLUCONAZOLE 150 MG PO TABS: 150.0000 mg | ORAL_TABLET | Freq: Once | ORAL | 0 refills | Status: AC

## 2021-09-28 NOTE — Progress Notes (Signed)
Patients presents for annual exam today. She states doing well with IUD. She had a thyroidectomy last week, recovering well. Patient is up to date on pap smear. Patient states PCP has ordered mammogram. Patients annual labs declined, obtained by PCP. Patient states no other questions or concerns at this time.

## 2021-09-28 NOTE — Progress Notes (Signed)
HPI:      [name] is a 51 y.o. [phone] who LMP was No LMP recorded. (Menstrual status: IUD).  Subjective:   She presents today for her annual examination.  She reports that she is generally doing well.  She still loves her IUD.  It has greatly reduced her bleeding.  She reports she occasionally has some spotting but no real periods. She recently underwent thyroidectomy for large thyroid nodules (benign).    Hx: The following portions of the patient's history were reviewed and updated as appropriate:             She  has a past medical history of Depression, Ectopic pregnancy, Kidney stones, and Seizures (HCC). She does not have a problem list on file. She  has a past surgical history that includes Tubal ligation and Thyroidectomy (09/20/2021). Her family history is not on file. She  reports that she has never smoked. She has never used smokeless tobacco. She reports current alcohol use of about 8.0 standard drinks of alcohol per week. She reports that she does not use drugs. She has a current medication list which includes the following prescription(s): brexpiprazole, calcium carb-cholecalciferol, fluconazole, levonorgestrel, levothyroxine, and venlafaxine hcl. She is allergic to augmentin [amoxicillin-pot clavulanate].       Review of Systems:  Review of Systems  Constitutional: Denied constitutional symptoms, night sweats, recent illness, fatigue, fever, insomnia and weight loss.  Eyes: Denied eye symptoms, eye pain, photophobia, vision change and visual disturbance.  Ears/Nose/Throat/Neck: Denied ear, nose, throat or neck symptoms, hearing loss, nasal discharge, sinus congestion and sore throat.  Cardiovascular: Denied cardiovascular symptoms, arrhythmia, chest pain/pressure, edema, exercise intolerance, orthopnea and palpitations.  Respiratory: Denied pulmonary symptoms, asthma, pleuritic pain, productive sputum, cough, dyspnea and wheezing.  Gastrointestinal: Denied,  gastro-esophageal reflux, melena, nausea and vomiting.  Genitourinary: Denied genitourinary symptoms including symptomatic vaginal discharge, pelvic relaxation issues, and urinary complaints.  Musculoskeletal: Denied musculoskeletal symptoms, stiffness, swelling, muscle weakness and myalgia.  Dermatologic: Denied dermatology symptoms, rash and scar.  Neurologic: Denied neurology symptoms, dizziness, headache, neck pain and syncope.  Psychiatric: Denied psychiatric symptoms, anxiety and depression.  Endocrine: Denied endocrine symptoms including hot flashes and night sweats.   Meds:   Current Outpatient Medications on File Prior to Visit  Medication Sig Dispense Refill   brexpiprazole (REXULTI) 1 MG TABS tablet Take by mouth.     Calcium Carb-Cholecalciferol (CALCIUM CARBONATE-VITAMIN D3 PO) Take by mouth.     levonorgestrel (MIRENA) 20 MCG/24HR IUD 1 each by Intrauterine route once.     levothyroxine (SYNTHROID) 175 MCG tablet Take 175 mcg by mouth daily before breakfast.     Venlafaxine HCl 225 MG TB24      No current facility-administered medications on file prior to visit.     Objective:     Vitals:   09/28/21 0919  BP: (!) 141/89  Pulse: 93    Filed Weights   09/28/21 0919  Weight: 239 lb 12.8 oz (108.8 kg)              Physical examination General NAD, Conversant  HEENT Atraumatic; Op clear with mmm.  Normo-cephalic. Pupils reactive. Anicteric sclerae  Thyroid/Neck Smooth without nodularity or enlargement. Normal ROM.  Neck Supple.  Skin No rashes, lesions or ulceration. Normal palpated skin turgor. No nodularity.  Breasts: No masses or discharge.  Symmetric.  No axillary adenopathy.  Lungs: Clear to auscultation.No rales or wheezes. Normal Respiratory effort, no retractions.  Heart: NSR.  No murmurs or rubs  appreciated. No periferal edema  Abdomen: Soft.  Non-tender.  No masses.  No HSM. No hernia  Extremities: Moves all appropriately.  Normal ROM for age. No  lymphadenopathy.  Neuro: Oriented to PPT.  Normal mood. Normal affect.     Pelvic:   Vulva: Normal appearance.  No lesions.  Vagina: No lesions or abnormalities noted.  Thick white vaginal discharge  Support: Normal pelvic support.  Urethra No masses tenderness or scarring.  Meatus Normal size without lesions or prolapse.  Cervix: Normal appearance.  No lesions.  IUD strings noted  Anus: Normal exam.  No lesions.  Perineum: Normal exam.  No lesions.        Bimanual   Uterus: Normal size.  Non-tender.  Mobile.  AV.  Adnexae: No masses.  Non-tender to palpation.  Cul-de-sac: Negative for abnormality.     Assessment:    [id number] There are no problems to display for this patient.    1. Well woman exam with routine gynecological exam   2. Monilial vulvovaginitis     Patient with monilia vaginitis.  Likely from recent round of antibiotics.   Plan:            1.  Basic Screening Recommendations The basic screening recommendations for asymptomatic women were discussed with the patient during her visit.  The age-appropriate recommendations were discussed with her and the rational for the tests reviewed.  When I am informed by the patient that another primary care physician has previously obtained the age-appropriate tests and they are up-to-date, only outstanding tests are ordered and referrals given as necessary.  Abnormal results of tests will be discussed with her when all of her results are completed.  Routine preventative health maintenance measures emphasized: Exercise/Diet/Weight control, Tobacco Warnings, Alcohol/Substance use risks and Stress Management 2.  Diflucan for monilia Orders No orders of the defined types were placed in this encounter.    Meds ordered this encounter  Medications   fluconazole (DIFLUCAN) 150 MG tablet    Sig: Take 1 tablet (150 mg total) by mouth once for 1 dose.    Dispense:  1 tablet    Refill:  0          F/U  Return in about 1 year  (around 09/29/2022) for Annual Physical.  Elonda Husky, M.D. 09/28/2021 9:44 AM

## 2022-10-17 ENCOUNTER — Telehealth: Payer: Self-pay | Admitting: Obstetrics and Gynecology

## 2022-10-17 NOTE — Telephone Encounter (Signed)
I am reaching out to you due to a schedule change.  You have an appointment on Wednesday, October 31, 2022 at 9:15 am with Dr. Logan Bores.  He will be in surgery that morning.    His next available date is November 13, 2022 at 9:15 am.  Will that date and time work for you?  If so or not, please call the office at [phone] to confirm or change the date.

## 2022-10-23 NOTE — Telephone Encounter (Signed)
I contacted the patient via phone. I left generic message for the patient to contact our office to confirm scheduling changes for her annual exam with Dr Logan Bores.

## 2022-10-25 NOTE — Telephone Encounter (Signed)
The patient received mychart message and is aware of scheduling change as of 10/17/2022  9:17 AM .

## 2022-10-31 ENCOUNTER — Ambulatory Visit: Payer: Commercial Managed Care - PPO | Admitting: Obstetrics and Gynecology

## 2022-11-13 ENCOUNTER — Ambulatory Visit (INDEPENDENT_AMBULATORY_CARE_PROVIDER_SITE_OTHER): Payer: Commercial Managed Care - PPO | Admitting: Obstetrics and Gynecology

## 2022-11-13 ENCOUNTER — Encounter: Payer: Self-pay | Admitting: Obstetrics and Gynecology

## 2022-11-13 DIAGNOSIS — Z30431 Encounter for routine checking of intrauterine contraceptive device: Secondary | ICD-10-CM

## 2022-11-13 DIAGNOSIS — Z01419 Encounter for gynecological examination (general) (routine) without abnormal findings: Secondary | ICD-10-CM

## 2022-11-13 DIAGNOSIS — B372 Candidiasis of skin and nail: Secondary | ICD-10-CM

## 2022-11-13 DIAGNOSIS — Z1231 Encounter for screening mammogram for malignant neoplasm of breast: Secondary | ICD-10-CM

## 2022-11-13 NOTE — Progress Notes (Signed)
Patients presents for annual exam today. She states doing well with current IUD. Up to date on pap smear. Due for mammogram, going to today to have this preformed. Annual labs are up to date by PCP. She states no other questions or concerns at this time.

## 2022-11-13 NOTE — Progress Notes (Signed)
HPI:      [name] is a 52 y.o. [id number] who LMP was No LMP recorded. (Menstrual status: IUD).  Subjective:   She presents today for her annual examination.  She reports that she was doing well.  Rarely has bleeding with her IUD.  When questioned she states that this time of year she does get redness and irritation under her breast and her C-section scar. She gets her blood work through her primary care physician. She would like her strings checked today.    Hx: The following portions of the patient's history were reviewed and updated as appropriate:             She  has a past medical history of Depression, Ectopic pregnancy, Kidney stones, and Seizures (HCC). She does not have a problem list on file. She  has a past surgical history that includes Tubal ligation and Thyroidectomy (09/20/2021). Her family history is not on file. She  reports that she has never smoked. She has never used smokeless tobacco. She reports current alcohol use of about 8.0 standard drinks of alcohol per week. She reports that she does not use drugs. She has a current medication list which includes the following prescription(s): brexpiprazole, calcium carb-cholecalciferol, levonorgestrel, levothyroxine, and venlafaxine hcl. She is allergic to augmentin [amoxicillin-pot clavulanate].       Review of Systems:  Review of Systems  Constitutional: Denied constitutional symptoms, night sweats, recent illness, fatigue, fever, insomnia and weight loss.  Eyes: Denied eye symptoms, eye pain, photophobia, vision change and visual disturbance.  Ears/Nose/Throat/Neck: Denied ear, nose, throat or neck symptoms, hearing loss, nasal discharge, sinus congestion and sore throat.  Cardiovascular: Denied cardiovascular symptoms, arrhythmia, chest pain/pressure, edema, exercise intolerance, orthopnea and palpitations.  Respiratory: Denied pulmonary symptoms, asthma, pleuritic pain, productive sputum, cough, dyspnea and  wheezing.  Gastrointestinal: Denied, gastro-esophageal reflux, melena, nausea and vomiting.  Genitourinary: Denied genitourinary symptoms including symptomatic vaginal discharge, pelvic relaxation issues, and urinary complaints.  Musculoskeletal: Denied musculoskeletal symptoms, stiffness, swelling, muscle weakness and myalgia.  Dermatologic: Denied dermatology symptoms, rash and scar.  Neurologic: Denied neurology symptoms, dizziness, headache, neck pain and syncope.  Psychiatric: Denied psychiatric symptoms, anxiety and depression.  Endocrine: Denied endocrine symptoms including hot flashes and night sweats.   Meds:   Current Outpatient Medications on File Prior to Visit  Medication Sig Dispense Refill   brexpiprazole (REXULTI) 1 MG TABS tablet Take by mouth.     Calcium Carb-Cholecalciferol (CALCIUM CARBONATE-VITAMIN D3 PO) Take by mouth.     levonorgestrel (MIRENA) 20 MCG/24HR IUD 1 each by Intrauterine route once.     levothyroxine (SYNTHROID) 175 MCG tablet Take 175 mcg by mouth daily before breakfast.     Venlafaxine HCl 225 MG TB24      No current facility-administered medications on file prior to visit.     Objective:     There were no vitals filed for this visit.  There were no vitals filed for this visit.            Physical examination General NAD, Conversant  HEENT Atraumatic; Op clear with mmm.  Normo-cephalic. Pupils reactive. Anicteric sclerae  Thyroid/Neck Smooth without nodularity or enlargement. Normal ROM.  Neck Supple.  Skin No rashes, lesions or ulceration. Normal palpated skin turgor. No nodularity.  Breasts: No masses or discharge.  Symmetric.  No axillary adenopathy.  Erythema under both breasts likely consistent with monilia.  Lungs: Clear to auscultation.No rales or wheezes. Normal Respiratory effort, no retractions.  Heart:  NSR.  No murmurs or rubs appreciated. No peripheral edema  Abdomen: Soft.  Non-tender.  No masses.  No HSM. No hernia   Extremities: Moves all appropriately.  Normal ROM for age. No lymphadenopathy.  Neuro: Oriented to PPT.  Normal mood. Normal affect.     Pelvic:   Vulva: Normal appearance.  No lesions.  Vagina: No lesions or abnormalities noted.  Support: Normal pelvic support.  Urethra No masses tenderness or scarring.  Meatus Normal size without lesions or prolapse.  Cervix: Normal appearance.  No lesions.  IUD strings noted  Anus: Normal exam.  No lesions.  Perineum: Normal exam.  No lesions.        Bimanual   Uterus: Normal size.  Non-tender.  Mobile.  AV.  Adnexae: No masses.  Non-tender to palpation.  Cul-de-sac: Negative for abnormality.     Assessment:    [id number] There are no problems to display for this patient.    1. Well woman exam with routine gynecological exam   2. Encounter for routine checking of intrauterine contraceptive device (IUD)   3. Moniliasis, cutaneous     Patient doing well for annual examination.   Plan:            1.  Basic Screening Recommendations The basic screening recommendations for asymptomatic women were discussed with the patient during her visit.  The age-appropriate recommendations were discussed with her and the rational for the tests reviewed.  When I am informed by the patient that another primary care physician has previously obtained the age-appropriate tests and they are up-to-date, only outstanding tests are ordered and referrals given as necessary.  Abnormal results of tests will be discussed with her when all of her results are completed.  Routine preventative health maintenance measures emphasized: Exercise/Diet/Weight control, Tobacco Warnings, Alcohol/Substance use risks and Stress Management 2.  Discussed cutaneous monilia and use of OTC antifungal cream or powder. Orders No orders of the defined types were placed in this encounter.   No orders of the defined types were placed in this encounter.          F/U  Return in about 1 year  (around 11/13/2023) for Annual Physical.  Elonda Husky, M.D. 11/13/2022 9:49 AM

## 2023-12-20 NOTE — Progress Notes (Addendum)
" °  [facility] - Patient Enrolled  This serves as confirmation that your patient, Margaret Cardenas, has enrolled in [place].  Margaret Cardenas was identified by community referral as a candidate for [facility] Care Management service(s).  An appointment has been scheduled for Margaret Cardenas on 12/30/23 with a [facility] Manager, Margaret Cardenas. Margaret Cardenas    [address]; [place], [place] [address] l  [email or web address] l [phone]   For more information on [facility] services, click here.   "

## 2023-12-30 NOTE — Progress Notes (Signed)
 Thank you for your referral to [facility].  I recently contacted Shona Shams and discussed the benefits of [facility] with her. Unfortunately, she decided not to continue enrollment in [facility] at this time.   As [employer] provider, you have the greatest influence on her care decisions. I recommend discussing the benefits of [facility] services at her next appointment and referring again at a later date.  We look forward to partnering with you on Shavone Campbells care in the future. Thank you, SHELLEY  LEWIS    [address]; [place], [place] [address] l  [email or web address] l [phone]   For more information on [facility] services, click here.

## 2024-03-30 NOTE — Progress Notes (Signed)
 ENDOCRINOLOGY (THYROID NEOPLASIA) TELEPHONE VISIT  PATIENT NAME: Margaret Cardenas  PATIENT DOB: 1970/04/16  PATIENT MRN: [id number]  DATE OF CONTACT: 03/30/2024 - I personally called the patient by phone with results and discussed management   LABS: TSH (mIU/L): 0.80 (11/20/21), 0.09 L (03/16/22), 0.32 L (05/18/22), 0.06 L (09/04/22), 0.25 L (11/01/22), 0.66 (12/27/22), 2.43 (03/08/23), 1.52 (05/09/23), 2.88 (09/05/23), 1.42 (11/07/23), 1.63 (03/30/24),    Free T4 (ng/dL): 8.81 (1/78/76), 8.79 (03/16/22), 1.61 H (05/18/22), 1.70 H (09/04/22), 1.38 H (11/01/22), 1.47 H (12/27/22),    LT4 dose (mcg): Generic 175 qD 1st post-op (11/20/21), generic 175 qD (03/16/22), generic 168 qD (05/18/22), generic 168 qD (09/04/22), generic 162 qD (11/01/22), generic 156 qD (12/27/22), generic 156 qD (03/08/23), generic 162 qD (05/29/23), generic 162 qD (09/05/23), generic 175 qD (11/07/23), generic 175 qD weight loss (03/30/24),   Lab Results  Component Value Date   TSH 1.63 03/30/2024   TSH 1.42 11/07/2023   TSH 2.88 09/05/2023   TSH 1.52 05/09/2023   TSH 2.43 03/08/2023   TSH 0.66 12/27/2022   TSH 0.25 (L) 11/01/2022   TSH 0.06 (L) 09/04/2022   TSH 0.32 (L) 05/18/2022   TSH 0.09 (L) 03/16/2022   TSH 0.80 11/20/2021   Lab Results  Component Value Date   T4FREE 0.92 03/30/2024   T4FREE 1.47 (H) 12/27/2022   T4FREE 1.38 (H) 11/01/2022   T4FREE 1.70 (H) 09/04/2022   T4FREE 1.61 (H) 05/18/2022   T4FREE 1.20 03/16/2022   T4FREE 1.18 11/20/2021    ASSESSMENT:  1+2. 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 Post-surgical hypothyroidism: - Currently clinically euthyroid and biochemically euthyroid - current goal TSH level for suppression therapy is lower limit normal (LLN) to 2.0 mIU/L - TSH level still at target despite notable weight loss reported by patient - will continue current dose but consider repeating testing if additional significant further weight loss occurs   PLAN:  1+2. 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 Call sooner if develop new or worsening symptoms, including anterior neck symptoms (pain, tenderness, or swelling) or persistent anterior cervical lymphadenopathy. FOLLOW-UP LABS to be done at [facility] prior to next appointment in 6 months = ThyCa panel (Thyroglobulin + Thyroglobulin antibodies [TgAb]). FOLLOW-UP RADIOLOGY to possibly be done at next appointment in 6 months = Thyroid/Neck ultrasound.    3. Post-surgical hypothyroidism: MEDICATIONS    1. Continue generic levothyroxine tablets at 175 mcg once PO daily. FOLLOW-UP in 6 months.  Call sooner if develop new or worsening symptoms, including hypothyroid symptoms (such as cold intolerance, constipation, dry skin, fatigue, brittle nails or hair) or thyrotoxicosis (palpitations, tachycardia, weight loss, tremor, hyperdefecation, insomnia, irritability).  FOLLOW-UP LABS to be done at [facility] prior to next appointment in 6 months = TSH with reflex Free T4.  No orders of the defined types were placed in this encounter.  Return in about 25 weeks (around 09/21/2024).  TODD ELSIE FARE, MD, FACP, FACE, ECNU, CCD   This telephone encounter was conducted  with the patient's (or proxy's) verbal consent via secure, interactive audio telecommunications while in clinic/office/hospital.  The patient (or proxy) was instructed to have this encounter in a suitably private space and to only have persons present to whom they give permission to participate. In addition, patient identity was confirmed by use of name plus an additional identifier.  05/20/19 E&M) This visit was coded based on time. I spent a total of 10 minutes in both face-to-face and non-face-to-face activities for this visit on the date of this encounter.

## 2024-05-07 NOTE — Progress Notes (Unsigned)
 "  ANNUAL GYNECOLOGICAL EXAM  SUBJECTIVE  HPI  Margaret Cardenas is a 54 y.o.-year-old [id number] who presents for an annual gynecological exam today.  She denies pelvic pain, dyspareunia, abnormal vaginal bleeding or discharge, and UTI symptoms. She would like her IUD strings checked. She reports concerns about occasional vaginal odor and thinks she may have a yeast infection. Also reports that she wears panty liner for nearly constant moisture, concerned about incontinence.  Medical/Surgical History Past Medical History:  Diagnosis Date   Depression    Ectopic pregnancy    Kidney stones    Seizures [facility])    Past Surgical History:  Procedure Laterality Date   THYROIDECTOMY  09/20/2021   TUBAL LIGATION      Social History  Exercise: no Substances: denies EtOH, tobacco, vape, and recreational drugs  Obstetric History OB History     Gravida  7   Para  2   Term  2   Preterm      AB  5   Living  2      SAB  3   IAB  1   Ectopic  1   Multiple      Live Births  2            GYN/Menstrual History No LMP recorded. (Menstrual status: IUD). Has not had period since 2021 when IUD was placed. Minor occasional spotting. Last Pap:09/07/20 Contraception:iud  Prevention Dentist- utd  Eye exam- utd  Mammogram done  Colonoscopy done last year  Flu shot/vaccines utd   Current Medications Show/hide medication list[1]      ROS Constitutional: Denied constitutional symptoms, night sweats, recent illness, fatigue, fever, insomnia and weight loss.  Eyes: Denied eye symptoms, eye pain, photophobia, vision change and visual disturbance.  Ears/Nose/Throat/Neck: Denied ear, nose, throat or neck symptoms, hearing loss, nasal discharge, sinus congestion and sore throat.  Cardiovascular: Denied cardiovascular symptoms, arrhythmia, chest pain/pressure, edema, exercise intolerance, orthopnea and palpitations.  Respiratory: Denied pulmonary symptoms, asthma, pleuritic pain,  productive sputum, cough, dyspnea and wheezing.  Gastrointestinal: Denied gastro-esophageal reflux, melena, nausea and vomiting.  Genitourinary: Endorsed stress incontinence. Denied genitourinary symptoms including symptomatic vaginal discharge, pelvic relaxation issues, and other urinary complaints.  Musculoskeletal: Denied musculoskeletal symptoms, stiffness, swelling, muscle weakness and myalgia.  Dermatologic: Denied dermatology symptoms, rash and scar.  Neurologic: Denied neurology symptoms, dizziness, headache, neck pain and syncope.  Psychiatric: Denied psychiatric symptoms, anxiety and depression.  Endocrine: Denied endocrine symptoms including hot flashes and night sweats.    OBJECTIVE  BP 109/71   Pulse 91   Ht 4' 11 (1.499 m)   Wt 233 lb (105.7 kg)   BMI 47.06 kg/m    Physical examination General NAD, Conversant  HEENT Atraumatic; Op clear with mmm.  Normo-cephalic. Pupils reactive. Anicteric sclerae  Thyroid/Neck Thyroid absent (s/p thyroidectomy). Normal ROM.  Neck Supple.  Skin No rashes, lesions or ulceration. Normal palpated skin turgor. No nodularity.  Breasts: No masses or discharge.  Symmetric.  No axillary adenopathy.  Lungs: Clear to auscultation.No rales or wheezes. Normal Respiratory effort, no retractions.  Heart: NSR.  No murmurs or rubs appreciated. No peripheral edema  Abdomen: Soft.  Non-tender.  No masses.  No HSM. No hernia  Extremities: Moves all appropriately.  Normal ROM for age. No lymphadenopathy.  Neuro: Oriented to PPT.  Normal mood. Normal affect.     Pelvic:   Vulva: Normal appearance.  No lesions.  Vagina: No lesions or abnormalities noted.  Support: Normal pelvic support.  Urethra  No masses tenderness or scarring.  Meatus Normal size without lesions or prolapse.  Cervix: Normal appearance.  No lesions. IUD strings not visualized. Pap collected.  Anus: Normal exam.  No lesions.  Perineum: Normal exam.  No lesions.    ASSESSMENT  1)  Annual exam 2) Due for Pap 3) IUD strings not visible  PLAN 1) Physical exam as noted. Discussed healthy lifestyle choices and preventive care. 2) Pap collected. F/u based on results 3) Since Damariz is not having any symptoms of a malpositioned or expelled IUD, do not recommend imaging at this time. Will order US  if symptoms develop or if there is difficult with removal of IUD  Return in one year for annual exam or as needed for concerns.   Eleanor Canny, CNM      [1]  Outpatient Medications Prior to Visit  Medication Sig   ARIPiprazole (ABILIFY) 2 MG tablet Take 2 mg by mouth.   escitalopram (LEXAPRO) 5 MG tablet Take 5 mg by mouth daily.   levonorgestrel  (MIRENA ) 20 MCG/24HR IUD 1 each by Intrauterine route once.   levothyroxine (SYNTHROID) 175 MCG tablet Take 175 mcg by mouth daily before breakfast.   lisinopril-hydrochlorothiazide (ZESTORETIC) 20-12.5 MG tablet Take 1 tablet by mouth daily.   phentermine 15 MG capsule Take by mouth.   topiramate (TOPAMAX) 25 MG tablet Take 25 mg by mouth 2 (two) times daily.   brexpiprazole (REXULTI) 1 MG TABS tablet Take by mouth. (Patient not taking: Reported on 05/08/2024)   Calcium Carb-Cholecalciferol (CALCIUM CARBONATE-VITAMIN D3 PO) Take by mouth. (Patient not taking: Reported on 05/08/2024)   [DISCONTINUED] Venlafaxine HCl 225 MG TB24    No facility-administered medications prior to visit.   "

## 2024-05-08 ENCOUNTER — Encounter: Payer: Self-pay | Admitting: Obstetrics

## 2024-05-08 ENCOUNTER — Ambulatory Visit: Admitting: Obstetrics

## 2024-05-08 VITALS — BP 109/71 | HR 91 | Ht 59.0 in | Wt 233.0 lb

## 2024-05-08 DIAGNOSIS — Z124 Encounter for screening for malignant neoplasm of cervix: Secondary | ICD-10-CM

## 2024-05-08 DIAGNOSIS — R32 Unspecified urinary incontinence: Secondary | ICD-10-CM

## 2024-05-08 DIAGNOSIS — Z1231 Encounter for screening mammogram for malignant neoplasm of breast: Secondary | ICD-10-CM
